# Patient Record
Sex: Female | Born: 1995 | Hispanic: No | Marital: Single
Health system: Southern US, Community
[De-identification: ages and names within clinical notes are randomized; demographics above are authoritative.]

---

## 2020-09-17 ENCOUNTER — Emergency Department (HOSPITAL_COMMUNITY)
Admission: EM | Admit: 2020-09-17 | Discharge: 2020-09-17 | Disposition: A | Discharge status: Home / Self Care | Payer: Medicaid Other | Attending: Emergency Medicine | Admitting: Emergency Medicine

## 2020-09-17 ENCOUNTER — Encounter (HOSPITAL_COMMUNITY): Payer: Self-pay | Admitting: Emergency Medicine

## 2020-09-17 ENCOUNTER — Emergency Department (HOSPITAL_COMMUNITY): Payer: Medicaid Other

## 2020-09-17 DIAGNOSIS — N920 Excessive and frequent menstruation with regular cycle: Secondary | ICD-10-CM

## 2020-09-17 DIAGNOSIS — K5900 Constipation, unspecified: Secondary | ICD-10-CM | POA: Diagnosis not present

## 2020-09-17 DIAGNOSIS — R110 Nausea: Secondary | ICD-10-CM | POA: Diagnosis not present

## 2020-09-17 DIAGNOSIS — N939 Abnormal uterine and vaginal bleeding, unspecified: Secondary | ICD-10-CM | POA: Diagnosis present

## 2020-09-17 DIAGNOSIS — N938 Other specified abnormal uterine and vaginal bleeding: Secondary | ICD-10-CM | POA: Diagnosis not present

## 2020-09-17 DIAGNOSIS — R102 Pelvic and perineal pain: Secondary | ICD-10-CM

## 2020-09-17 LAB — BASIC METABOLIC PANEL
Anion gap: 10 (ref 5–15)
BUN: 9 mg/dL (ref 6–20)
CO2: 24 mmol/L (ref 22–32)
Calcium: 9.6 mg/dL (ref 8.9–10.3)
Chloride: 105 mmol/L (ref 98–111)
Creatinine, Ser: 0.85 mg/dL (ref 0.44–1.00)
GFR, Estimated: >60 mL/min (ref >60)
Glucose, Bld: 97 mg/dL (ref 70–99)
Potassium: 4.6 mmol/L (ref 3.5–5.1)
Sodium: 139 mmol/L (ref 135–145)

## 2020-09-17 LAB — CBC WITH DIFFERENTIAL/PLATELET
Abs Immature Granulocytes: 0.01 10*3/uL (ref 0.00–0.07)
Basophils Absolute: 0 10*3/uL (ref 0.0–0.1)
Basophils Relative: 1 %
Eosinophils Absolute: 0.1 10*3/uL (ref 0.0–0.5)
Eosinophils Relative: 2 %
HCT: 40.9 % (ref 36.0–46.0)
Hemoglobin: 13.6 g/dL (ref 12.0–15.0)
Immature Granulocytes: 0 %
Lymphocytes Relative: 39 %
Lymphs Abs: 2.2 10*3/uL (ref 0.7–4.0)
MCH: 31.1 pg (ref 26.0–34.0)
MCHC: 33.3 g/dL (ref 30.0–36.0)
MCV: 93.6 fL (ref 80.0–100.0)
Monocytes Absolute: 0.3 10*3/uL (ref 0.1–1.0)
Monocytes Relative: 5 %
Neutro Abs: 3.1 10*3/uL (ref 1.7–7.7)
Neutrophils Relative %: 53 %
Platelets: 279 10*3/uL (ref 150–400)
RBC: 4.37 MIL/uL (ref 3.87–5.11)
RDW: 12.1 % (ref 11.5–15.5)
WBC: 5.8 10*3/uL (ref 4.0–10.5)
nRBC: 0 % (ref 0.0–0.2)

## 2020-09-17 LAB — WET PREP, GENITAL
Clue Cells Wet Prep HPF POC: NONE SEEN
Sperm: NONE SEEN
Trich, Wet Prep: NONE SEEN
Yeast Wet Prep HPF POC: NONE SEEN

## 2020-09-17 LAB — URINALYSIS, ROUTINE W REFLEX MICROSCOPIC
Bilirubin Urine: NEGATIVE
Glucose, UA: 50 mg/dL — AB
Ketones, ur: NEGATIVE mg/dL
Nitrite: NEGATIVE
Protein, ur: 100 mg/dL — AB
Specific Gravity, Urine: 1.004 — ABNORMAL LOW (ref 1.005–1.030)
pH: 7 (ref 5.0–8.0)

## 2020-09-17 LAB — I-STAT BETA HCG BLOOD, ED (MC, WL, AP ONLY): I-stat hCG, quantitative: <5 m[IU]/mL (ref <5)

## 2020-09-17 MED ORDER — ACETAMINOPHEN 325 MG PO TABS
650.0000 mg | ORAL_TABLET | Freq: Once | ORAL | Status: AC
  Administered 2020-09-17: 650 mg via ORAL
  Filled 2020-09-17: qty 2

## 2020-09-17 MED ORDER — MEGESTROL ACETATE 40 MG PO TABS: 40.0000 mg | ORAL_TABLET | Freq: Two times a day (BID) | ORAL | 0 refills | Status: AC

## 2020-09-17 NOTE — ED Triage Notes (Signed)
Pt c/o persistent vaginal bleed since 1/26, pt states her period was delay for 2 weeks.

## 2020-09-17 NOTE — ED Notes (Signed)
Pt verbalized understanding of d/c instructions, medications and follow up as well as setting up Mychart.  Pt ambulatory with steady gait to waiting room. NAD

## 2020-09-17 NOTE — ED Notes (Signed)
Pt reports LMP was Oct 10th and usually lasts 5 days. Pt reports she missed her period in early Nov. Pt reports she has had heavy dark and light red bleeding w/ clots and "tissue" since Nov 26th. Pt is unsure if she is having a miscarriage. Pt hasn't take a pregnancy test yet to see because the day she was going to take the test is the day she started bleeding per pt.

## 2020-09-17 NOTE — Discharge Instructions (Addendum)
Seen here for menorrhea.  Lab work and imaging looks reassuring.  Start you on progesterone please take as prescribed.  Recommend over-the-counter pain medications like Tylenol as needed for pain.  Please call dosing the back of bottle.  Spoke with OB/GYN their office should contact you to schedule a appointment, if you do not hear from them within the next 5 days please call them to schedule an appointment.  Come back to the emergency department if you develop chest pain, shortness of breath, severe abdominal pain, uncontrolled nausea, vomiting, diarrhea.

## 2020-09-17 NOTE — ED Provider Notes (Signed)
[facility] EMERGENCY DEPARTMENT Provider Note   CSN: [id number] Arrival date & time: 09/17/20  1126     History Chief Complaint  Patient presents with  . Vaginal Bleeding    Kayla Stanton is a 24 y.o. female.  HPI   Patient with no significant medical history presents to the emergency department with chief complaint of vaginal bleeding.  Patient states bleeding has been persistent since November 26.  She noted that her menstrual cycle started then and has continued.  She endorses  heavy vaginal bleeding with clots, endorses left-sided pelvic pain, nausea without vomiting and constipation, denies urinary symptoms, no vaginal discharge.  Patient states she is not on birth control, was taken off Depo in June. Generally her menstrual cycles last 3 days and is pretty consistent.  She states her last menstrual cycle was about 1 week late, she is sexually active does not use protection.  She was recently seen at her primary care provider earlier in November where she had a negative STD panel but had a yeast infection was treated with Diflucan.  She recently was moved here and has not seen an OB/GYN, last Pap smear was performed.  Half ago denies any abnormalities.  Patient denies alleviating factors.  Patient denies headaches, nasal congestion, sore throat, cough, chest pain, shortness of breath, abdominal pain, vomiting, urinary symptoms, vaginal discharge, pedal edema.  History reviewed. No pertinent past medical history.  There are no problems to display for this patient.   History reviewed. No pertinent surgical history.   OB History   No obstetric history on file.     No family history on file.     Home Medications Prior to Admission medications   Medication Sig Start Date End Date Taking? Authorizing Provider  megestrol (MEGACE) 40 MG tablet Take 1 tablet (40 mg total) by mouth 2 (two) times daily. 09/17/20 10/17/20  Carroll Sage, PA-C    Allergies     Lactose intolerance (gi) and Sulfa antibiotics  Review of Systems   Review of Systems  Constitutional: Positive for chills and fever.  HENT: Negative for congestion and sore throat.   Respiratory: Negative for shortness of breath.   Cardiovascular: Negative for chest pain.  Gastrointestinal: Positive for constipation and nausea. Negative for abdominal pain, diarrhea and vomiting.  Genitourinary: Positive for frequency, pelvic pain and vaginal bleeding. Negative for difficulty urinating, dyspareunia, dysuria, enuresis, vaginal discharge and vaginal pain.  Musculoskeletal: Negative for back pain.  Skin: Negative for rash.  Neurological: Negative for headaches.  Hematological: Does not bruise/bleed easily.    Physical Exam Updated Vital Signs BP 118/72   Pulse 77   Temp 98.1 F (36.7 C) (Oral)   Resp 17   Ht 5\' 8"  (1.727 m)   Wt 77 kg   SpO2 97%   BMI 25.82 kg/m   Physical Exam Vitals and nursing note reviewed. Exam conducted with a chaperone present.  Constitutional:      General: She is not in acute distress.    Appearance: Normal appearance. She is not ill-appearing.  HENT:     Head: Normocephalic and atraumatic.     Nose: No congestion or rhinorrhea.  Eyes:     Conjunctiva/sclera: Conjunctivae normal.  Cardiovascular:     Rate and Rhythm: Normal rate and regular rhythm.     Pulses: Normal pulses.     Heart sounds: No murmur heard. No friction rub. No gallop.   Pulmonary:     Effort: No respiratory distress.  Breath sounds: No stridor. No wheezing, rhonchi or rales.  Abdominal:     General: There is no distension.     Palpations: Abdomen is soft.     Tenderness: There is abdominal tenderness. There is no right CVA tenderness, left CVA tenderness or guarding.     Comments: Patient abdomen was nondistended, normoactive bowel sounds, dull to percussion.  She had noted tenderness to palpation in her left and right lower pelvic regions.  There is no rebound  tenderness, no peritoneal signs present.  Negative CVA tenderness.  Genitourinary:    Vagina: No vaginal discharge.     Comments: Pelvic exam performed, exterior genitalia was examined blood was noted on the external genitalia, there was no lesions, rashes or discharge noted.  Vaginal canal was patent, pink, no lesions but had moderate amount of blood coming from patient's cervix.  no trauma noted.  Cervix was visualized there was no lesions, nontender to palpation, patient did have noted left sided adnexal pain. Musculoskeletal:     Right lower leg: No edema.     Left lower leg: No edema.     Comments: Patient is moving all 4 extremities at difficulty.  Skin:    General: Skin is warm and dry.     Findings: No rash.  Neurological:     Mental Status: She is alert.  Psychiatric:        Mood and Affect: Mood normal.     ED Results / Procedures / Treatments   Labs (all labs ordered are listed, but only abnormal results are displayed) Labs Reviewed  WET PREP, GENITAL - Abnormal; Notable for the following components:      Result Value   WBC, Wet Prep HPF POC RARE (*)    All other components within normal limits  URINALYSIS, ROUTINE W REFLEX MICROSCOPIC - Abnormal; Notable for the following components:   Color, Urine RED (*)    APPearance HAZY (*)    Specific Gravity, Urine 1.004 (*)    Glucose, UA 50 (*)    Hgb urine dipstick LARGE (*)    Protein, ur 100 (*)    Leukocytes,Ua TRACE (*)    Bacteria, UA RARE (*)    All other components within normal limits  CBC WITH DIFFERENTIAL/PLATELET  BASIC METABOLIC PANEL  I-STAT BETA HCG BLOOD, ED ([facility], [facility], [facility] ONLY)  SAMPLE TO BLOOD BANK  GC/CHLAMYDIA PROBE AMP ([place]) NOT AT [facility]    EKG None  Radiology US PELVIC COMPLETE W TRANSVAGINAL AND TORSION R/O  Result Date: 09/17/2020 CLINICAL DATA:  Menorrhagia, left adnexal tenderness EXAM: TRANSABDOMINAL AND TRANSVAGINAL ULTRASOUND OF PELVIS DOPPLER ULTRASOUND OF OVARIES TECHNIQUE: Both  transabdominal and transvaginal ultrasound examinations of the pelvis were performed. Transabdominal technique was performed for global imaging of the pelvis including uterus, ovaries, adnexal regions, and pelvic cul-de-sac. It was necessary to proceed with endovaginal exam following the transabdominal exam to visualize the uterus, endometrium, bilateral ovaries, bilateral adnexa. Color and duplex Doppler ultrasound was utilized to evaluate blood flow to the ovaries. COMPARISON:  None. FINDINGS: Uterus Measurements: 8.3 x 4 x 4.6 cm = volume: 80 mL. No fibroids or other mass visualized. Endometrium Thickness: 2.  No focal abnormality visualized. Right ovary Measurements: 2.2 x 1.8 x 2 cm = volume: 4 mL. Normal appearance/no adnexal mass. Left ovary Measurements: 3.6 x 3 x 2.8 cm = volume: 18 mL. There is a 2.7 x 2.4 x 3.1 cm anechoic cystic lesion demonstrating increased through transmission and no internal vascularity or  definite septation. Pulsed Doppler evaluation of both ovaries demonstrates normal low-resistance arterial and venous waveforms. Other findings No abnormal free fluid. IMPRESSION: 1. A 3.1 cm left ovarian simple cyst. No follow up imaging recommended. Note: This recommendation does not apply to premenarchal patients or to those with increased risk (genetic, family history, elevated tumor markers or other high-risk factors) of ovarian cancer. Reference: Radiology 2019 Nov; 293(2):359-371. 2. No findings to suggest ovarian torsion; however, please note this can be an intermittent finding. Electronically Signed   By: Tish FredericksonMorgane  Naveau M.D.   On: 09/17/2020 15:17    Procedures Procedures (including critical care time)  Medications Ordered in ED Medications  acetaminophen (TYLENOL) tablet 650 mg (650 mg Oral Given 09/17/20 1436)    ED Course  I have reviewed the triage vital signs and the nursing notes.  Pertinent labs & imaging results that were available during my care of the patient were  reviewed by me and considered in my medical decision making (see chart for details).    MDM Rules/Calculators/A&P                          Patient presents with abnormal vaginal bleeding.  She is alert, does not appear acute distress, vital signs reassuring.  Due to menorrhea with unknown etiology will obtain basic lab work-up, perform pelvic exam and reevaluate.  Pelvic exam showed moderate amount of blood in the vaginal vault coming from the cervix with left-sided adnexal pain.  Concern for possible structural abnormality like fibroids versus hemorrhagic cyst, ovarian torsion will send patient down for ultrasound for further evaluation.  CBC negative for leukocytosis or signs of anemia, BMP negative for electrolyte abnormalities, no metabolic acidosis, no AKI, no anion gap present.  I-STAT beta hCG was less than 5.  Wet prep shows rare white blood cells.  UA shows large hemoglobin, 100 bacteria, trace leukocytes, moderate red and white blood cells, rare bacteria, squamous cells present.  Transvaginal ultrasound showed 3.1 cm left ovarian simple cyst no further follow-up imaging recommended at this time.  No findings to suggest ovarian torsion.  Due to heavy bleeding noted on pelvic exam with an unknown etiology will speak with on-call OB/GYN for further recommendations.  Spoke with Dr. Lonia Farberonstantine who recommends starting patient on Megace and have her follow-up at her office for further evaluation.  Low suspicion for ectopic pregnancy as hCG is less than 5.  Low suspicion for UTI or pyelonephritis as patient denies urinary symptoms, no CVA tenderness noted on my exam.  UA does show red blood cells and white blood cells but it appears to be contaminated with squamous cells will defer antibiotics at this time as she denies urinary symptoms.  Low suspicion for intra-abdominal abnormality requiring immediate intervention as abdomen soft nontender to palpation, tolerating p.o. without difficulty.  Low  suspicion for PID/STI as patient denies vaginal discharge or vaginal pain.  Low suspicion for ovarian torsion or other uterine abnormalities as there is no acute findings noted on imaging other than a simple 3.1 cm cyst.  Suspect this may been causing her left adnexal pain.  Suspect patient suffering from menorrhea with unclear etiology will have patient follow-up with OB/GYN for further evaluation.  Vital signs have remained stable, no indication for hospital admission.  Patient discussed with attending and they agreed with assessment and plan.  Patient given at home care as well strict return precautions.  Patient verbalized that they understood agreed to said plan.  Final Clinical Impression(s) / ED Diagnoses Final diagnoses:  Dysfunctional uterine bleeding    Rx / DC Orders ED Discharge Orders         Ordered    megestrol (MEGACE) 40 MG tablet  2 times daily        09/17/20 1541           Barnie Del 09/17/20 1603    Gerhard Munch, MD 09/17/20 1700

## 2020-09-20 LAB — GC/CHLAMYDIA PROBE AMP (~~LOC~~) NOT AT ARMC
Chlamydia: NEGATIVE
Comment: NEGATIVE
Comment: NORMAL
Neisseria Gonorrhea: NEGATIVE

## 2020-09-20 LAB — SAMPLE TO BLOOD BANK

## 2020-09-21 ENCOUNTER — Encounter: Payer: Self-pay | Admitting: Family Medicine

## 2020-10-05 ENCOUNTER — Encounter: Payer: Medicaid Other | Admitting: Family Medicine

## 2021-11-20 IMAGING — US US PELVIS COMPLETE TRANSABD/TRANSVAG W DUPLEX
1 series · 13 of 25 positions shown · non-contrast
Comparison: None.

CLINICAL DATA: Menorrhagia, left adnexal tenderness

EXAM:
TRANSABDOMINAL AND TRANSVAGINAL ULTRASOUND OF PELVIS
DOPPLER ULTRASOUND OF OVARIES
TECHNIQUE: Both transabdominal and transvaginal ultrasound examinations of the
pelvis were performed. Transabdominal technique was performed for
global imaging of the pelvis including uterus, ovaries, adnexal
regions, and pelvic cul-de-sac.
It was necessary to proceed with endovaginal exam following the
transabdominal exam to visualize the uterus, endometrium, bilateral
ovaries, bilateral adnexa. Color and duplex Doppler ultrasound was
utilized to evaluate blood flow to the ovaries.

[Series 1: us pelvic complete w transvaginal and torsion righ · 13 of 117 slices shown]
[im 1/117]
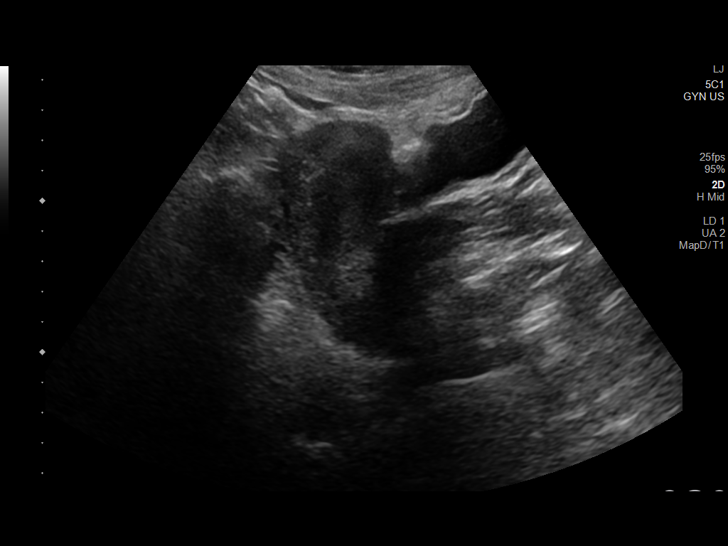
[im 10/117]
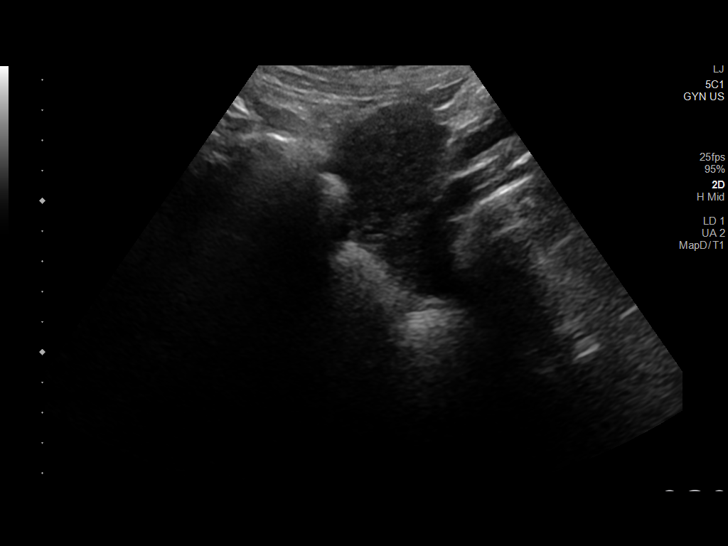
[im 20/117]
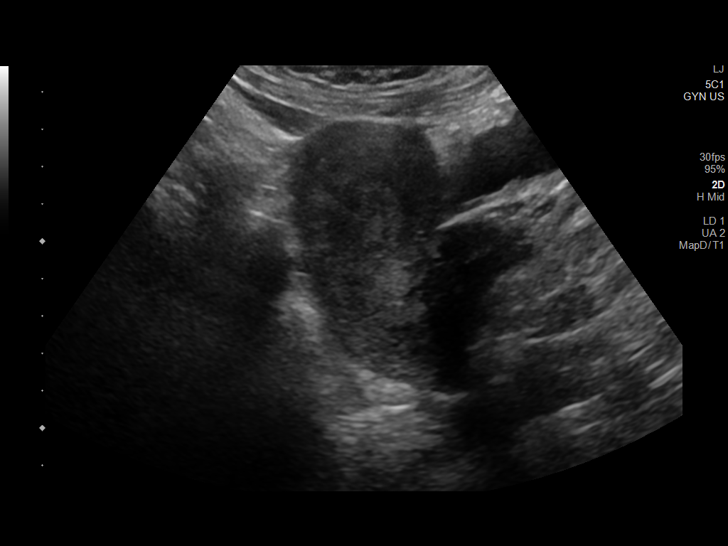
[im 30/117]
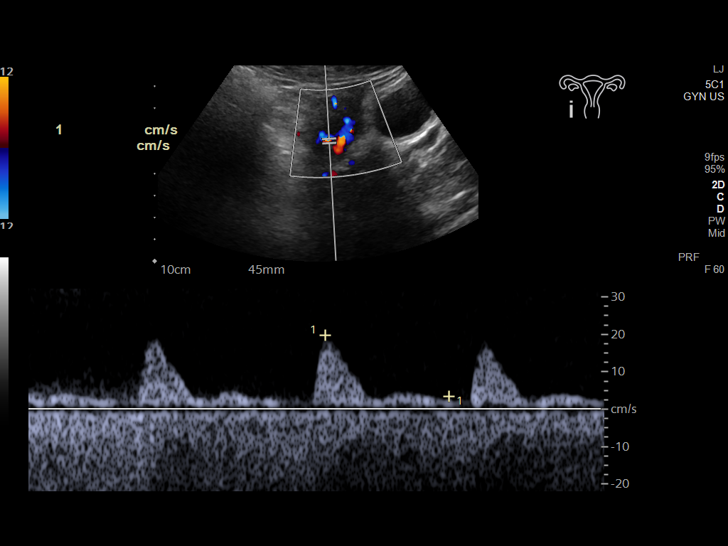
[im 39/117]
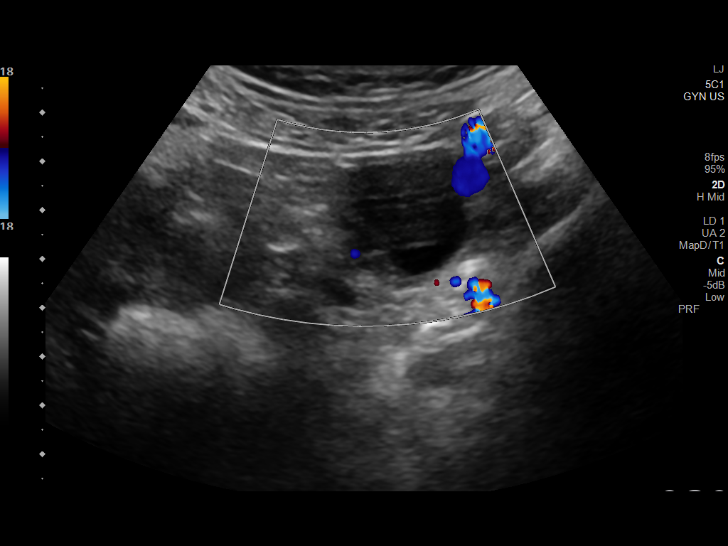
[im 49/117]
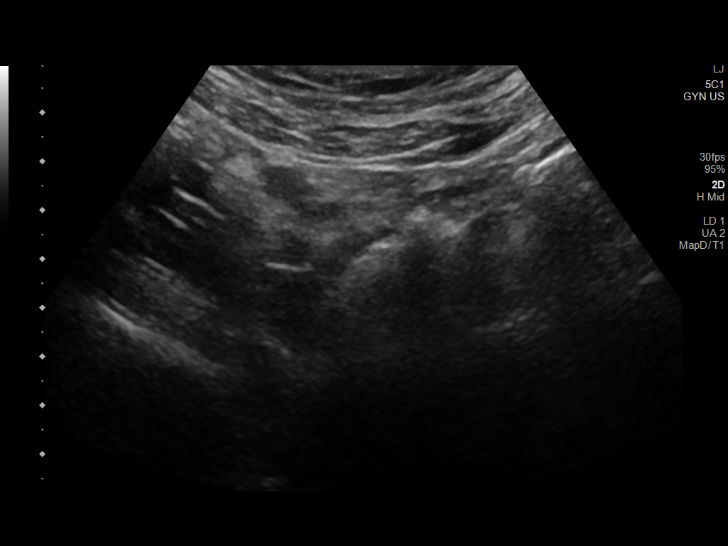
[im 59/117]
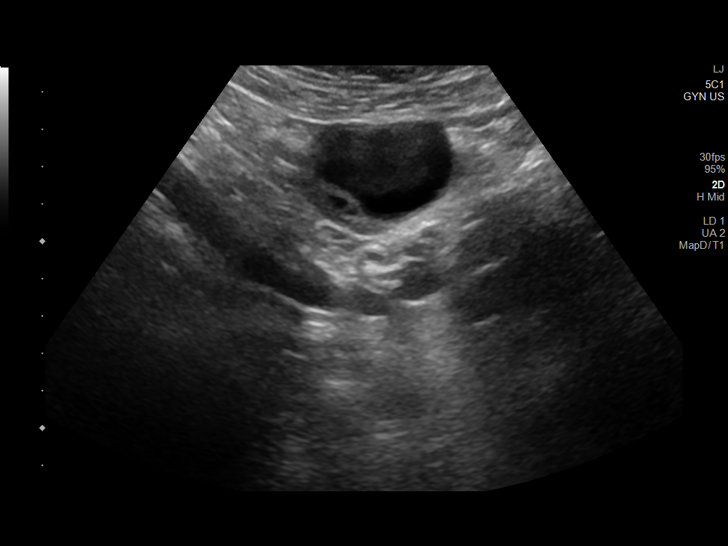
[im 68/117]
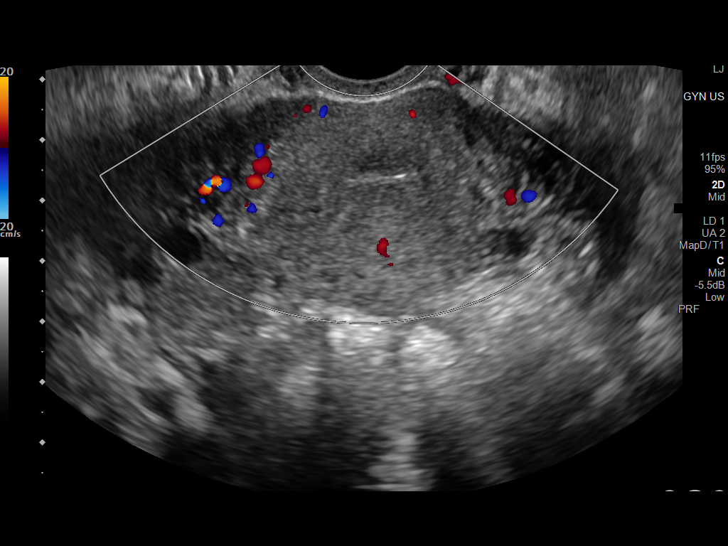
[im 78/117]
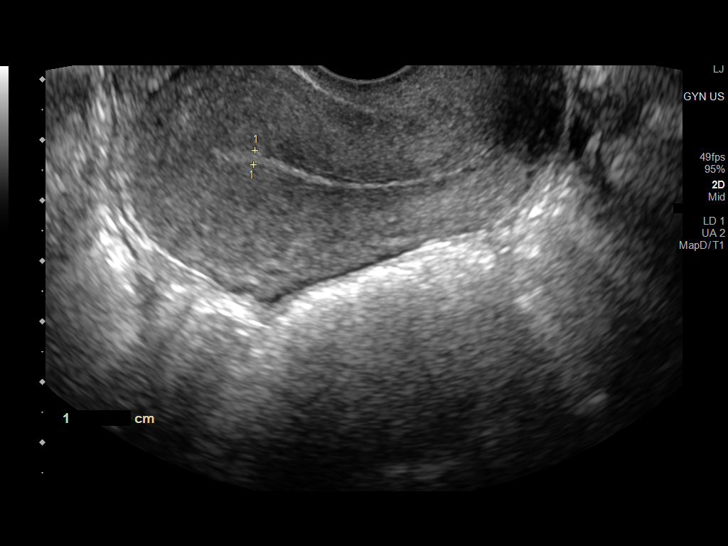
[im 88/117]
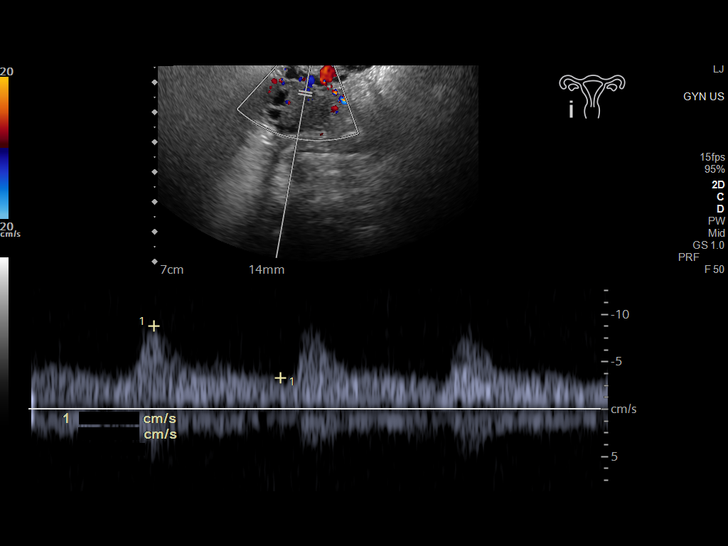
[im 97/117]
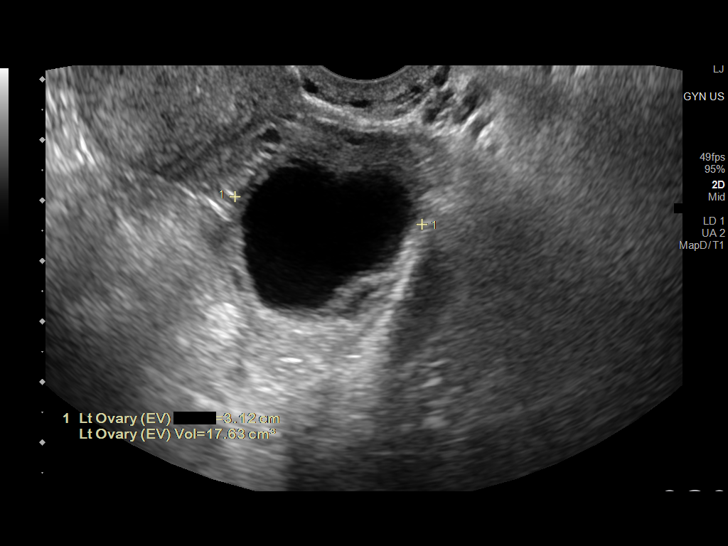
[im 107/117]
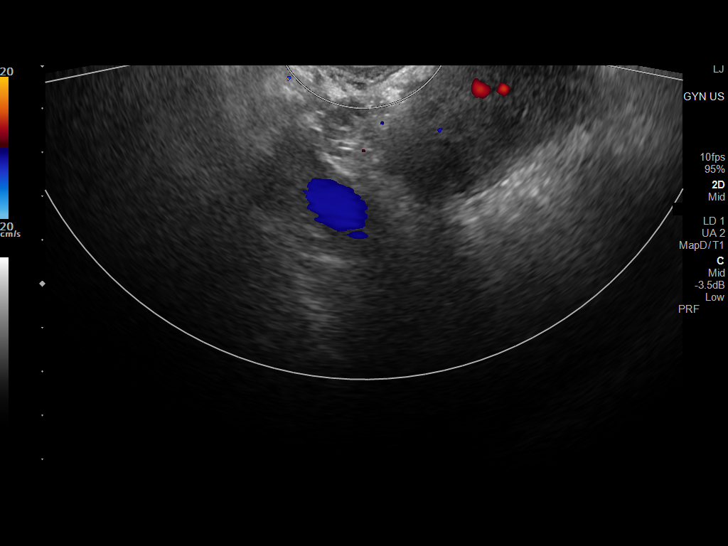
[im 117/117]
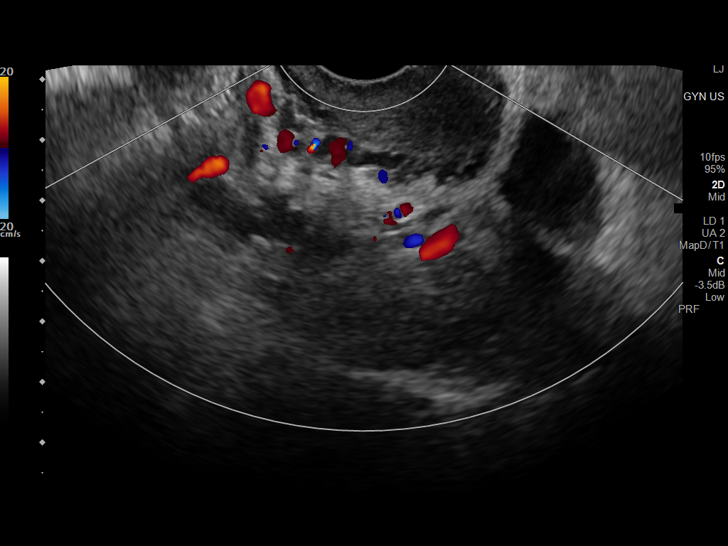

[13 of 25 positions shown; findings below may reference images not displayed]

FINDINGS: Uterus

Measurements: 8.3 x 4 x 4.6 cm = volume: 80 mL. No fibroids or other
mass visualized.

Endometrium

Thickness: 2.  No focal abnormality visualized.

Right ovary

Measurements: 2.2 x 1.8 x 2 cm = volume: 4 mL. Normal appearance/no
adnexal mass.

Left ovary

Measurements: 3.6 x 3 x 2.8 cm = volume: 18 mL. There is a 2.7 x
x 3.1 cm anechoic cystic lesion demonstrating increased through
transmission and no internal vascularity or definite septation.

Pulsed Doppler evaluation of both ovaries demonstrates normal
low-resistance arterial and venous waveforms.

Other findings

No abnormal free fluid.
IMPRESSION: 1. A 3.1 cm left ovarian simple cyst. No follow up imaging
recommended. Note: This recommendation does not apply to
premenarchal patients or to those with increased risk (genetic,
family history, elevated tumor markers or other high-risk factors)
of ovarian cancer. Reference: Radiology [DATE]):[id number].
2. No findings to suggest ovarian torsion; however, please note this
can be an intermittent finding.
